# Patient Record
Sex: Female | Born: 2000 | Hispanic: No | Marital: Single | Smoking: Never smoker
Health system: Southern US, Community
[De-identification: ages and names within clinical notes are randomized; demographics above are authoritative.]

---
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
## 2018-06-23 ENCOUNTER — Emergency Department (HOSPITAL_COMMUNITY)
Admission: EM | Admit: 2018-06-23 | Discharge: 2018-06-24 | Disposition: A | Discharge status: Home / Self Care | Payer: Self-pay | Attending: Emergency Medicine | Admitting: Emergency Medicine

## 2018-06-23 DIAGNOSIS — Z5321 Procedure and treatment not carried out due to patient leaving prior to being seen by health care provider: Secondary | ICD-10-CM | POA: Insufficient documentation

## 2018-06-24 ENCOUNTER — Encounter (HOSPITAL_COMMUNITY): Payer: Self-pay | Admitting: Emergency Medicine

## 2018-06-24 ENCOUNTER — Telehealth: Payer: Self-pay

## 2018-06-24 ENCOUNTER — Other Ambulatory Visit: Payer: Self-pay

## 2018-06-24 ENCOUNTER — Emergency Department (HOSPITAL_COMMUNITY)
Admission: EM | Admit: 2018-06-24 | Discharge: 2018-06-24 | Disposition: A | Discharge status: Home / Self Care | Payer: Self-pay | Attending: Emergency Medicine | Admitting: Emergency Medicine

## 2018-06-24 DIAGNOSIS — N611 Abscess of the breast and nipple: Secondary | ICD-10-CM | POA: Insufficient documentation

## 2018-06-24 MED ORDER — SULFAMETHOXAZOLE-TRIMETHOPRIM 800-160 MG PO TABS: 1.0000 | ORAL_TABLET | Freq: Two times a day (BID) | ORAL | 0 refills | Status: AC

## 2018-06-24 MED ORDER — CEPHALEXIN 500 MG PO CAPS: 500.0000 mg | ORAL_CAPSULE | Freq: Four times a day (QID) | ORAL | 0 refills | Status: AC

## 2018-06-24 NOTE — ED Notes (Signed)
Pt and family decided to leave due to excessive wait time. Expressed to pt that it is understood that she does not want to wait but the area on her right breast needs to be checked by a physician. Pt and family leaving now.

## 2018-06-24 NOTE — Telephone Encounter (Signed)
[facility] CM requesting a [facility]. call placed to # [phone] (M) in attempt to schedule an appointment and a message was left requesting a call back to this CM # [phone]. [facility] her ED AVS.

## 2018-06-24 NOTE — ED Triage Notes (Signed)
Pt arriving with small lump on right breast. Pt states it has been there for a little over a week and it is bleeding. Area is purple/red. Painful to touch.

## 2018-06-24 NOTE — Care Management Note (Signed)
Case Management Note  CM consulted for no pcp and no ins with need for follow up.  [facility].  [facility] to advise of pt's needs.  Sunday SpillersUpdated Neese, NP.  No further CM needs noted at this time.  Legna Mausolf, Lynnae SandhoffAngela N, RN 06/24/2018, 1:35 PM

## 2018-06-24 NOTE — ED Provider Notes (Signed)
[facility]-EMERGENC[facility] DEPT Provider Note   CSN: [id number] Arrival date & time: 06/24/18  1052     History   Chief Complaint Chief Complaint  Patient presents with  . Breast Pain    HPI Grace Cantrell is a 17 y.o. female who presents to the ED with breast pain. Patient reports right breast pain and bruising. Also reports that last night she came but it was to busy so she left. Last night she had drainage from the area.   HPI  History reviewed. No pertinent past medical history.  Patient Active Problem List   Diagnosis Date Noted  . Suicidal ideation 12/21/2016  . 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 No pertinent surgical history.   OB History   None      Home Medications    Prior to Admission medications   Medication Sig Start Date End Date Taking? Authorizing Provider  cephALEXin (KEFLEX) 500 MG capsule Take 1 capsule (500 mg total) by mouth 4 (four) times daily. 06/24/18   Janne Napoleon, NP  ibuprofen (ADVIL,MOTRIN) 600 MG tablet Take 1 tablet (600 mg total) by mouth every 6 (six) hours as needed. 09/05/17   Rolan Bucco, MD  sulfamethoxazole-trimethoprim (BACTRIM DS,SEPTRA DS) 800-160 MG tablet Take 1 tablet by mouth 2 (two) times daily for 7 days. 06/24/18 07/01/18  Janne Napoleon, NP    Family History No family history on file.  Social History Social History   Tobacco Use  . Smoking status: Never Smoker  . Smokeless tobacco: Never Used  Substance Use Topics  . Alcohol use: No  . Drug use: No     Allergies   Patient has no known allergies.   Review of Systems Review of Systems  Cardiovascular:       Right breast pain  Skin: Positive for wound.  All other systems reviewed and are negative.    Physical Exam Updated Vital Signs BP 115/73 (BP Location: Right Arm)   Pulse 88   Temp 98.4 F (36.9 C) (Oral)   Resp 16   Ht 5' (1.524 m)   Wt 72.6 kg    LMP 06/24/2018 (Exact Date)   SpO2 99%   BMI 31.25 kg/m   Physical Exam  Constitutional: She is oriented to person, place, and time. She appears well-developed and well-nourished. No distress.  HENT:  Head: Normocephalic.  Eyes: EOM are normal.  Neck: Neck supple.  Cardiovascular: Normal rate.  Pulmonary/Chest: Effort normal.  2 cm raised tender area right breast with ecchymosis noted. No drainage at this time.     Musculoskeletal: Normal range of motion.  Neurological: She is alert and oriented to person, place, and time. No cranial nerve deficit.  Skin: Skin is warm and dry.  Psychiatric: She has a normal mood and affect.  Nursing note and vitals reviewed.    ED Treatments / Results  Labs (all labs ordered are listed, but only abnormal results are displayed) Labs Reviewed - No data to display  Radiology No results found.  Procedures: discussed with the patient I&D vs antibiotics and [facility]. Patient states that since the area was draining last night she will try antibiotics and warm compresses and will call the [facility] for appointment.   Procedures (including critical care time)  Medications Ordered in ED Medications - No data to display   Initial Impression / Assessment and Plan / ED Course  I have  reviewed the triage vital signs and the nursing notes. 17 y.o. female here with pain and swelling to the right breast stable for d/c without fever and does not appear toxic. Will treat with antibiotics and she will f/u with the breast center.   Final Clinical Impressions(s) / ED Diagnoses   Final diagnoses:  Breast abscess    ED Discharge Orders         Ordered    sulfamethoxazole-trimethoprim (BACTRIM DS,SEPTRA DS) 800-160 MG tablet  2 times daily     06/24/18 1224    cephALEXin (KEFLEX) 500 MG capsule  4 times daily     06/24/18 1224           Kerrie Buffaloeese, Roselind Klus [place], [place] 06/24/18 1232    Marily MemosMesner, Jason, MD 06/24/18 1627

## 2018-06-24 NOTE — ED Triage Notes (Signed)
Per pt, states right breast pain-bruise right of the nipple-states noticed over a week ago-states some bloody drainage and pain upon palpation-

## 2018-06-24 NOTE — Discharge Instructions (Addendum)
Take Advil for pain and inflammation. Call the breast center to schedule an appointment. Tell them you were seen in the ED and referred to them for further evaluation of your breast mass.

## 2018-07-02 ENCOUNTER — Telehealth: Payer: Self-pay

## 2018-07-02 NOTE — Telephone Encounter (Signed)
Attempted to contact the patient to discuss scheduling a follow up appointment at [facility]   Call placed to #[phone] (M) and a voicemail message was left requesting a call back to # [phone].

## 2018-07-10 ENCOUNTER — Telehealth: Payer: Self-pay

## 2018-07-10 NOTE — Telephone Encounter (Signed)
Attempted to contact patient again to discuss scheduling a follow up appointment at [facility] clinic.  Call placed to # [phone] (M) and a message was left requesting a call back to this CM # [phone]
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
## 2019-06-21 ENCOUNTER — Other Ambulatory Visit: Payer: Self-pay

## 2019-06-21 DIAGNOSIS — Z20822 Contact with and (suspected) exposure to covid-19: Secondary | ICD-10-CM

## 2019-06-22 LAB — NOVEL CORONAVIRUS, NAA: SARS-CoV-2, NAA: NOT DETECTED

## 2021-04-21 ENCOUNTER — Other Ambulatory Visit: Payer: Self-pay

## 2021-04-21 DIAGNOSIS — N6310 Unspecified lump in the right breast, unspecified quadrant: Secondary | ICD-10-CM

## 2021-06-07 ENCOUNTER — Ambulatory Visit
Admission: RE | Admit: 2021-06-07 | Discharge: 2021-06-07 | Disposition: A | Discharge status: Home / Self Care | Payer: No Typology Code available for payment source | Source: Ambulatory Visit | Attending: Obstetrics and Gynecology | Admitting: Obstetrics and Gynecology

## 2021-06-07 ENCOUNTER — Ambulatory Visit: Payer: Self-pay | Admitting: *Deleted

## 2021-06-07 ENCOUNTER — Other Ambulatory Visit: Payer: Self-pay

## 2021-06-07 VITALS — BP 102/72 | Wt 121.9 lb

## 2021-06-07 DIAGNOSIS — N6313 Unspecified lump in the right breast, lower outer quadrant: Secondary | ICD-10-CM

## 2021-06-07 DIAGNOSIS — N6310 Unspecified lump in the right breast, unspecified quadrant: Secondary | ICD-10-CM

## 2021-06-07 DIAGNOSIS — Z1239 Encounter for other screening for malignant neoplasm of breast: Secondary | ICD-10-CM

## 2021-06-07 NOTE — Patient Instructions (Addendum)
Explained breast self awareness with Grace Cantrell. Patient did not need a Pap smear today due to last Pap smear was 11/30/2020. Let patient know that her next Pap smear is due in one year due to her last Pap smear was abnormal. Referred patient to the [facility] for a right breast ultrasound. Appointment scheduled Tuesday, June 07, 2021 at 1250. Patient aware of appointment and will be there. Discussed smoking cessation with patient. Referred to the [facility] Quitline and gave resources to the free smoking cessation classes at [facility]. Grace Cantrell verbalized understanding.  [name], [name], RN 11:20 AM

## 2021-06-07 NOTE — Progress Notes (Signed)
[name] is a 20 y.o. female who presents to [facility] clinic today with complaint of right breast lump since September 2019 that was bleeding. Patient stated she was given antibiotics and did not take them. Patient states the lump has decreased but she can still feel the lump.    Pap Smear: Pap smear not completed today. Last Pap smear was 11/30/2020 at the [facility] clinic and was abnormal - ASCUS with positive HPV . Per ASCCP guidelines repeat Pap smear in one year for follow-up and her most recent Pap smear is the only Pap smear she has had. Last Pap smear result is not available in Epic. Last Pap smear result will be scanned into Epic.   Physical exam: Breasts Breasts symmetrical. Multiple scars right breast and axilla due to history of boils per patient. No skin abnormalities observed left breast. No nipple retraction bilateral breasts. No nipple discharge bilateral breasts. No lymphadenopathy. No lumps palpated left breast. Palpated a bb sized mobile lump within the right breast at 7 o'clock next to areola. No complaints of pain or tenderness on exam.     Pelvic/Bimanual Pap is not indicated today per BCCCP guidelines.   Smoking History: Patient is a current smoker. Discussed smoking cessation with patient. Referred to the [facility] Quitline and gave resources to the free smoking cessation classes at [facility].   Patient Navigation: Patient education provided. Access to services provided for patient through BCCCP program.    Breast and Cervical Cancer Risk Assessment: Patient does not have family history of breast cancer, known genetic mutations, or radiation treatment to the chest before age 56. Patient does not have history of cervical dysplasia, immunocompromised, or DES exposure in-utero. Breast cancer risk assessment completed. No breast cancer risk calculated due to patient is less than 84 years old.  Risk Assessment     Risk Scores       06/07/2021    Last edited by: Meryl Dare, CMA   5-year risk:    Lifetime risk:             A: BCCCP exam without pap smear Complaint of right breast lump.  P: Referred patient to the [facility] for a right breast ultrasound. Appointment scheduled Tuesday, June 07, 2021 at 1250.  Priscille Heidelberg, RN 06/07/2021 11:20 AM

## 2022-10-24 IMAGING — US US BREAST*R* LIMITED INC AXILLA
1 series · 4 of 4 positions shown · non-contrast
Comparison: None.

CLINICAL DATA: 20-year-old female with lump along the skin of the
right breast.

EXAM:
ULTRASOUND OF THE RIGHT BREAST

[Series 1: us breast*right* limited inc axilla · 0.05mm/px · 4 of 4 slices shown]
[im 1/4]
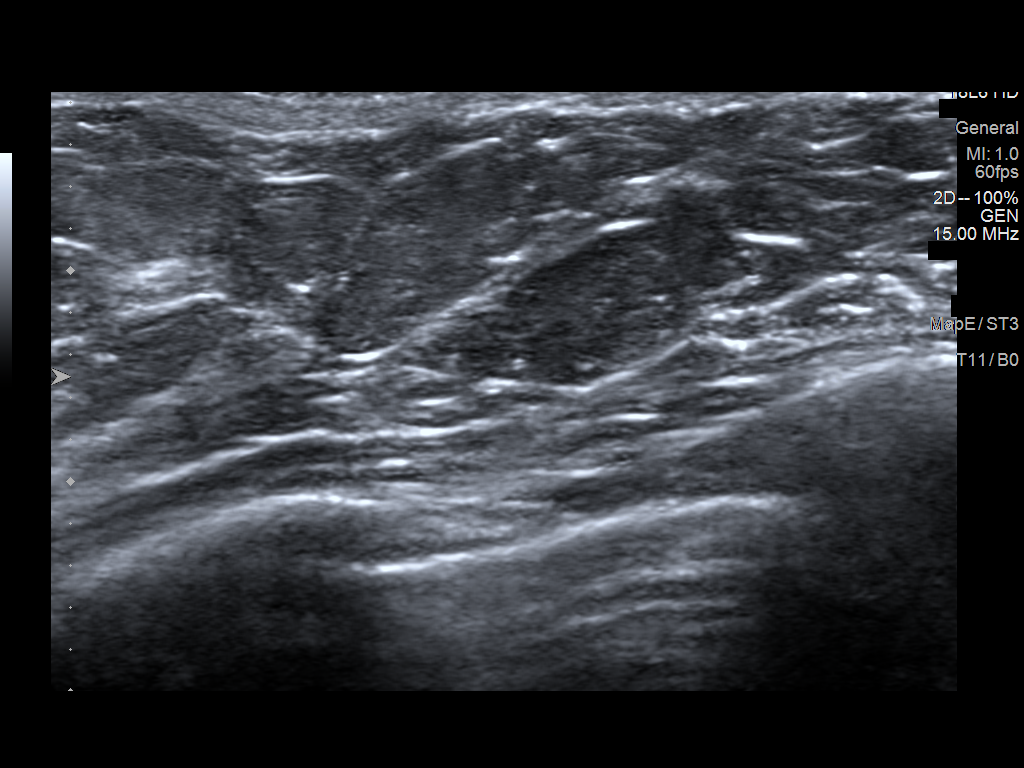
[im 2/4]
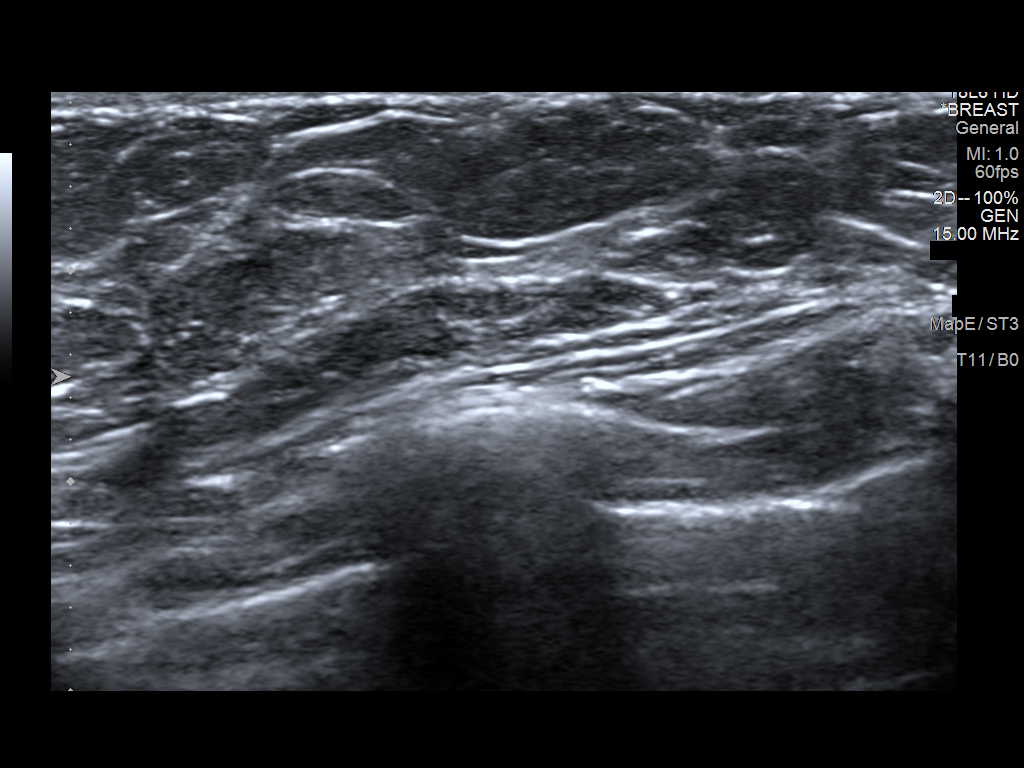
[im 3/4]
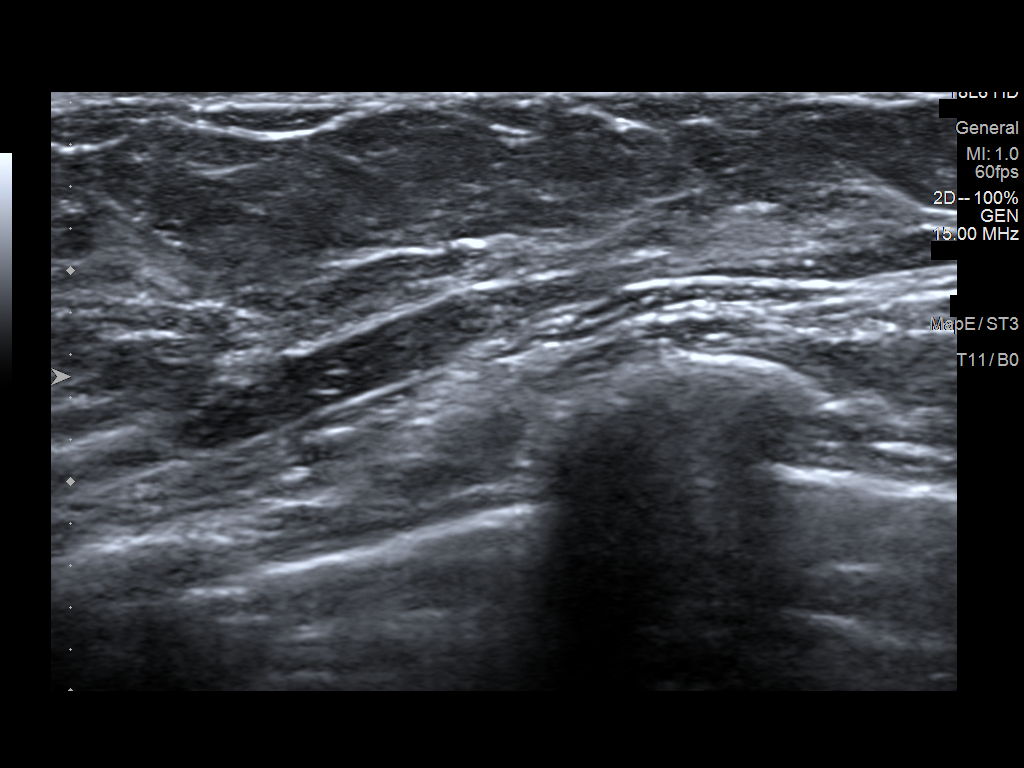
[im 4/4]
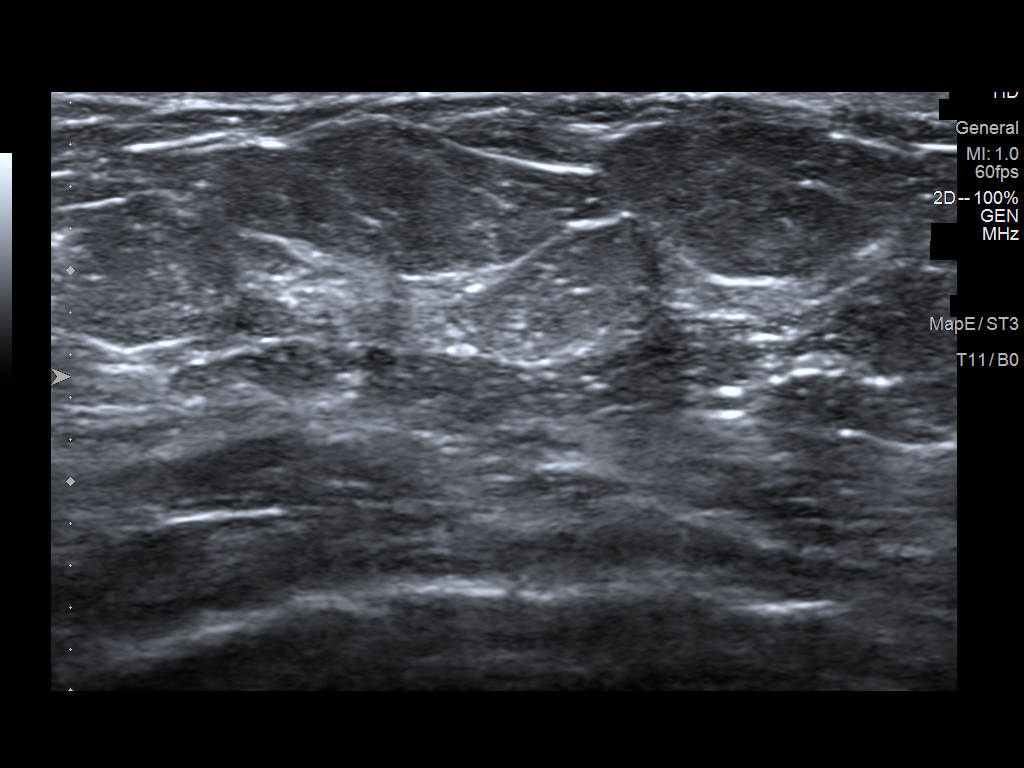

[4 of 4 positions shown; findings below may reference images not displayed]

FINDINGS: Targeted ultrasound is performed, showing no focal or suspicious
sonographic findings along the lower outer quadrant of the right
breast.
IMPRESSION: Unremarkable ultrasound evaluation of the right breast.

RECOMMENDATION:
1. Clinical follow-up recommended for the symptomatic area of
concern in the right breast. Any further workup should be based on
clinical grounds.
2. Screening mammogram at age 38 unless there are persistent or
intervening clinical concerns. (Code:U3-3-Y76)

I have discussed the findings and recommendations with the patient.
If applicable, a reminder letter will be sent to the patient
regarding the next appointment.

BI-RADS CATEGORY  1: Negative.
